# Patient Record
Sex: Male | Born: 1990 | Race: Black or African American | Hispanic: No | Marital: Single | Smoking: Never smoker
Health system: Southern US, Community
[De-identification: ages and names within clinical notes are randomized; demographics above are authoritative.]

---

## 2018-08-08 ENCOUNTER — Encounter (HOSPITAL_BASED_OUTPATIENT_CLINIC_OR_DEPARTMENT_OTHER): Payer: Self-pay | Admitting: Emergency Medicine

## 2018-08-08 ENCOUNTER — Emergency Department (HOSPITAL_BASED_OUTPATIENT_CLINIC_OR_DEPARTMENT_OTHER)
Admission: EM | Admit: 2018-08-08 | Discharge: 2018-08-08 | Disposition: A | Discharge status: Home / Self Care | Payer: Self-pay | Attending: Emergency Medicine | Admitting: Emergency Medicine

## 2018-08-08 ENCOUNTER — Ambulatory Visit (HOSPITAL_BASED_OUTPATIENT_CLINIC_OR_DEPARTMENT_OTHER)
Admission: RE | Admit: 2018-08-08 | Discharge: 2018-08-08 | Disposition: A | Discharge status: Home / Self Care | Payer: Self-pay | Source: Ambulatory Visit | Attending: Emergency Medicine | Admitting: Emergency Medicine

## 2018-08-08 ENCOUNTER — Other Ambulatory Visit: Payer: Self-pay

## 2018-08-08 DIAGNOSIS — Z711 Person with feared health complaint in whom no diagnosis is made: Secondary | ICD-10-CM

## 2018-08-08 DIAGNOSIS — R112 Nausea with vomiting, unspecified: Secondary | ICD-10-CM | POA: Insufficient documentation

## 2018-08-08 DIAGNOSIS — R197 Diarrhea, unspecified: Secondary | ICD-10-CM | POA: Insufficient documentation

## 2018-08-08 DIAGNOSIS — R160 Hepatomegaly, not elsewhere classified: Secondary | ICD-10-CM | POA: Insufficient documentation

## 2018-08-08 DIAGNOSIS — R748 Abnormal levels of other serum enzymes: Secondary | ICD-10-CM

## 2018-08-08 DIAGNOSIS — R1013 Epigastric pain: Secondary | ICD-10-CM | POA: Insufficient documentation

## 2018-08-08 LAB — CBC WITH DIFFERENTIAL/PLATELET
Abs Immature Granulocytes: 0.02 10*3/uL (ref 0.00–0.07)
Basophils Absolute: 0 10*3/uL (ref 0.0–0.1)
Basophils Relative: 0 %
Eosinophils Absolute: 0.1 10*3/uL (ref 0.0–0.5)
Eosinophils Relative: 1 %
HCT: 44.9 % (ref 39.0–52.0)
Hemoglobin: 13.5 g/dL (ref 13.0–17.0)
Immature Granulocytes: 0 %
Lymphocytes Relative: 20 %
Lymphs Abs: 1.6 10*3/uL (ref 0.7–4.0)
MCH: 20.6 pg — ABNORMAL LOW (ref 26.0–34.0)
MCHC: 30.1 g/dL (ref 30.0–36.0)
MCV: 68.7 fL — ABNORMAL LOW (ref 80.0–100.0)
Monocytes Absolute: 1 10*3/uL (ref 0.1–1.0)
Monocytes Relative: 12 %
Neutro Abs: 5.2 10*3/uL (ref 1.7–7.7)
Neutrophils Relative %: 67 %
Platelets: 213 10*3/uL (ref 150–400)
RBC: 6.54 MIL/uL — ABNORMAL HIGH (ref 4.22–5.81)
RDW: 17.5 % — ABNORMAL HIGH (ref 11.5–15.5)
WBC: 7.9 10*3/uL (ref 4.0–10.5)
nRBC: 0 % (ref 0.0–0.2)

## 2018-08-08 LAB — URINALYSIS, ROUTINE W REFLEX MICROSCOPIC
Bilirubin Urine: NEGATIVE
Glucose, UA: NEGATIVE mg/dL
Hgb urine dipstick: NEGATIVE
Ketones, ur: NEGATIVE mg/dL
Leukocytes,Ua: NEGATIVE
Nitrite: NEGATIVE
Protein, ur: NEGATIVE mg/dL
Specific Gravity, Urine: >1.03 — ABNORMAL HIGH (ref 1.005–1.030)
pH: 5.5 (ref 5.0–8.0)

## 2018-08-08 LAB — LIPASE, BLOOD: Lipase: 188 U/L — ABNORMAL HIGH (ref 11–51)

## 2018-08-08 LAB — COMPREHENSIVE METABOLIC PANEL
ALT: 18 U/L (ref 0–44)
AST: 19 U/L (ref 15–41)
Albumin: 4.4 g/dL (ref 3.5–5.0)
Alkaline Phosphatase: 39 U/L (ref 38–126)
Anion gap: 10 (ref 5–15)
BUN: 15 mg/dL (ref 6–20)
CO2: 23 mmol/L (ref 22–32)
Calcium: 9.2 mg/dL (ref 8.9–10.3)
Chloride: 103 mmol/L (ref 98–111)
Creatinine, Ser: 0.97 mg/dL (ref 0.61–1.24)
GFR calc Af Amer: >60 mL/min (ref >60)
GFR calc non Af Amer: >60 mL/min (ref >60)
Glucose, Bld: 115 mg/dL — ABNORMAL HIGH (ref 70–99)
Potassium: 3.5 mmol/L (ref 3.5–5.1)
Sodium: 136 mmol/L (ref 135–145)
Total Bilirubin: 1 mg/dL (ref 0.3–1.2)
Total Protein: 7.8 g/dL (ref 6.5–8.1)

## 2018-08-08 MED ORDER — LIDOCAINE HCL (PF) 1 % IJ SOLN
INTRAMUSCULAR | Status: AC
  Administered 2018-08-08: 04:00:00 5 mL
  Filled 2018-08-08: qty 5

## 2018-08-08 MED ORDER — ONDANSETRON 4 MG PO TBDP: 4.0000 mg | ORAL_TABLET | Freq: Three times a day (TID) | ORAL | 0 refills | Status: AC | PRN

## 2018-08-08 MED ORDER — ONDANSETRON 4 MG PO TBDP
4.0000 mg | ORAL_TABLET | Freq: Once | ORAL | Status: AC
  Administered 2018-08-08: 03:00:00 4 mg via ORAL
  Filled 2018-08-08: qty 1

## 2018-08-08 MED ORDER — AZITHROMYCIN 250 MG PO TABS
1000.0000 mg | ORAL_TABLET | Freq: Once | ORAL | Status: AC
  Administered 2018-08-08: 1000 mg via ORAL
  Filled 2018-08-08: qty 4

## 2018-08-08 MED ORDER — METRONIDAZOLE 500 MG PO TABS
2000.0000 mg | ORAL_TABLET | Freq: Once | ORAL | Status: AC
  Administered 2018-08-08: 04:00:00 2000 mg via ORAL
  Filled 2018-08-08: qty 4

## 2018-08-08 MED ORDER — CEFTRIAXONE SODIUM 250 MG IJ SOLR
250.0000 mg | Freq: Once | INTRAMUSCULAR | Status: AC
  Administered 2018-08-08: 04:00:00 250 mg via INTRAMUSCULAR
  Filled 2018-08-08: qty 250

## 2018-08-08 NOTE — Discharge Instructions (Signed)
You were seen today for abdominal bloating, vomiting, and loose stools.  Your work-up shows a mildly elevated lipase.  This can sometimes represent inflammation of the pancreas.  Avoid alcohol.  Return later today for an ultrasound of your gallbladder.  Clear liquid diet until symptoms improve.  You were given Zofran for nausea.  Your urine did not show any evidence of trichomonas.  Your additional STD testing is pending.  You were treated.  You need to abstain from sexual activity for the next 10 days.  Always use a condom.

## 2018-08-08 NOTE — ED Provider Notes (Signed)
[facility] EMERGENCY DEPARTMENT Provider Note   CSN: [id number] Arrival date & time: 08/08/18  0200    History   Chief Complaint Chief Complaint  Patient presents with  . Diarrhea  . Emesis    HPI Albert Carey is a 28 y.o. male.     HPI  This is a 28 year old male who presents with vomiting and diarrhea.  Patient reports that "my stomach has felt weird" for the last 2 weeks.  He reports a full sensation after eating.He states that sometimes it wakes him up at night.  It woke him up tonight and he had one episode of nonbilious, nonbloody emesis.  Additionally he has had loose stools.  He denies any blood in his stools.  Patient denies any recent travel or sick contacts.  He denies fevers.  He does report possible exposure to trichomonas.  He denies any symptoms including penile discharge or dysuria.  He currently has no abdominal pain.  He has not taken anything for his symptoms.  History reviewed. No pertinent past medical history.  There are no active problems to display for this patient.   History reviewed. No pertinent surgical history.      Home Medications    Prior to Admission medications   Medication Sig Start Date End Date Taking? Authorizing Provider  ondansetron (ZOFRAN ODT) 4 MG disintegrating tablet Take 1 tablet (4 mg total) by mouth every 8 (eight) hours as needed. 08/08/18   Gwendelyn Lanting, Mayer Masker, MD    Family History No family history on file.  Social History Social History   Tobacco Use  . Smoking status: Never Smoker  . Smokeless tobacco: Never Used  Substance Use Topics  . Alcohol use: Never    Frequency: Never  . Drug use: Never     Allergies   Patient has no known allergies.   Review of Systems Review of Systems  Constitutional: Negative for fever.  Respiratory: Negative for shortness of breath.   Cardiovascular: Negative for chest pain.  Gastrointestinal: Positive for abdominal pain, diarrhea, nausea and vomiting.   Genitourinary: Negative for discharge and dysuria.  All other systems reviewed and are negative.    Physical Exam Updated Vital Signs BP (!) 148/90 (BP Location: Right Arm)   Pulse 88   Temp 99.2 F (37.3 C) (Oral)   Resp 18   Ht 1.778 m (5\' 10" )   Wt 81.6 kg   SpO2 100%   BMI 25.83 kg/m   Physical Exam Vitals signs and nursing note reviewed.  Constitutional:      Appearance: He is well-developed. He is not ill-appearing.  HENT:     Head: Normocephalic and atraumatic.  Eyes:     Pupils: Pupils are equal, round, and reactive to light.  Cardiovascular:     Rate and Rhythm: Normal rate and regular rhythm.     Heart sounds: Normal heart sounds. No murmur.  Pulmonary:     Effort: Pulmonary effort is normal. No respiratory distress.     Breath sounds: Normal breath sounds. No wheezing.  Abdominal:     General: Bowel sounds are normal.     Palpations: Abdomen is soft.     Tenderness: There is no abdominal tenderness. There is no rebound.  Lymphadenopathy:     Cervical: No cervical adenopathy.  Skin:    General: Skin is warm and dry.  Neurological:     Mental Status: He is alert and oriented to person, place, and time.  Psychiatric:  Mood and Affect: Mood normal.      ED Treatments / Results  Labs (all labs ordered are listed, but only abnormal results are displayed) Labs Reviewed  URINALYSIS, ROUTINE W REFLEX MICROSCOPIC - Abnormal; Notable for the following components:      Result Value   Specific Gravity, Urine >1.030 (*)    All other components within normal limits  CBC WITH DIFFERENTIAL/PLATELET - Abnormal; Notable for the following components:   RBC 6.54 (*)    MCV 68.7 (*)    MCH 20.6 (*)    RDW 17.5 (*)    All other components within normal limits  LIPASE, BLOOD - Abnormal; Notable for the following components:   Lipase 188 (*)    All other components within normal limits  COMPREHENSIVE METABOLIC PANEL - Abnormal; Notable for the following  components:   Glucose, Bld 115 (*)    All other components within normal limits  GC/CHLAMYDIA PROBE AMP ([place]) NOT AT [facility]    EKG None  Radiology No results found.  Procedures Procedures (including critical care time)  Medications Ordered in ED Medications  cefTRIAXone (ROCEPHIN) injection 250 mg (has no administration in time range)  metroNIDAZOLE (FLAGYL) tablet 2,000 mg (has no administration in time range)  azithromycin (ZITHROMAX) tablet 1,000 mg (has no administration in time range)  lidocaine (PF) (XYLOCAINE) 1 % injection (has no administration in time range)  ondansetron (ZOFRAN-ODT) disintegrating tablet 4 mg (4 mg Oral Given 08/08/18 0231)     Initial Impression / Assessment and Plan / ED Course  I have reviewed the triage vital signs and the nursing notes.  Pertinent labs & imaging results that were available during my care of the patient were reviewed by me and considered in my medical decision making (see chart for details).        Patient presents with abdominal bloating.  1 episode of emesis and loose stools at home.  Also reports concerns for STD.  He is overall nontoxic-appearing and has a benign abdomen.  He was tested and treated for STDs.  Lab work-up is largely unremarkable with exception of a lipase of 188.  This may represent mild pancreatitis.  Patient reports that he occasionally drinks alcohol but that seems to have exacerbated his symptoms.  I recommend abstaining from alcohol.  He is able to tolerate fluids without difficulty.  Will trial symptom control at home.  He will return for right upper quadrant ultrasound to rule out gallbladder etiology.  After history, exam, and medical workup I feel the patient has been appropriately medically screened and is safe for discharge home. Pertinent diagnoses were discussed with the patient. Patient was given return precautions.   Final Clinical Impressions(s) / ED Diagnoses   Final diagnoses:  Nausea  vomiting and diarrhea  Elevated lipase  Concern about STD in male without diagnosis    ED Discharge Orders         Ordered    US Abdomen Limited RUQ/Gall Bladder     08/08/18 0329    ondansetron (ZOFRAN ODT) 4 MG disintegrating tablet  Every 8 hours PRN     08/08/18 0329           Shon BatonHorton, Andreea Arca F, MD 08/08/18 [phone]

## 2018-08-08 NOTE — ED Triage Notes (Signed)
Reports "my food has not been digesting and soft stools" for a few weeks. Denies fever, cough, SHOB. Also mentions possible trichomonas exposure. Denies pain.

## 2018-08-09 LAB — GC/CHLAMYDIA PROBE AMP (~~LOC~~) NOT AT ARMC
Chlamydia: NEGATIVE
Neisseria Gonorrhea: NEGATIVE

## 2019-03-13 ENCOUNTER — Emergency Department (HOSPITAL_COMMUNITY)
Admission: EM | Admit: 2019-03-13 | Discharge: 2019-03-13 | Disposition: A | Discharge status: Home / Self Care | Payer: Self-pay | Attending: Emergency Medicine | Admitting: Emergency Medicine

## 2019-03-13 ENCOUNTER — Other Ambulatory Visit: Payer: Self-pay

## 2019-03-13 ENCOUNTER — Encounter (HOSPITAL_COMMUNITY): Payer: Self-pay

## 2019-03-13 DIAGNOSIS — Z202 Contact with and (suspected) exposure to infections with a predominantly sexual mode of transmission: Secondary | ICD-10-CM | POA: Insufficient documentation

## 2019-03-13 LAB — URINALYSIS, ROUTINE W REFLEX MICROSCOPIC
Bacteria, UA: NONE SEEN
Bilirubin Urine: NEGATIVE
Glucose, UA: NEGATIVE mg/dL
Hgb urine dipstick: NEGATIVE
Ketones, ur: NEGATIVE mg/dL
Leukocytes,Ua: NEGATIVE
Nitrite: NEGATIVE
Protein, ur: NEGATIVE mg/dL
Specific Gravity, Urine: 1.032 — ABNORMAL HIGH (ref 1.005–1.030)
pH: 6 (ref 5.0–8.0)

## 2019-03-13 LAB — HIV ANTIBODY (ROUTINE TESTING W REFLEX): HIV Screen 4th Generation wRfx: NONREACTIVE

## 2019-03-13 MED ORDER — AZITHROMYCIN 250 MG PO TABS
1000.0000 mg | ORAL_TABLET | Freq: Once | ORAL | Status: AC
  Administered 2019-03-13: 1000 mg via ORAL
  Filled 2019-03-13: qty 4

## 2019-03-13 MED ORDER — CEFTRIAXONE SODIUM 250 MG IJ SOLR
250.0000 mg | Freq: Once | INTRAMUSCULAR | Status: AC
  Administered 2019-03-13: 250 mg via INTRAMUSCULAR
  Filled 2019-03-13: qty 250

## 2019-03-13 NOTE — Discharge Instructions (Addendum)
Will be called to your STD results if they are positive.  If positive you will need to notify your partners.  You were given antibiotics in the emergency department in case her gonorrhea or chlamydia test was positive.  If your HIV and syphilis test is positive you will need follow-up with infectious disease.  Return to emergency department for any new or worsening symptoms.

## 2019-03-13 NOTE — ED Provider Notes (Addendum)
[facility] EMERGENCY DEPARTMENT Provider Note   CSN: [id number] Arrival date & time: 03/13/19  1324     History Chief Complaint  Patient presents with  . Exposure to STD    Albert Carey is a 28 y.o. male with no prior medical history who presents for evaluation of abnormal sensation with urination.  Patient states today he noticed it "feels weird" when he urinates.  He denies any pain.  He is concerned about STDs.  Denies any drainage.  He is sexually active with male and uses protection.  Patient requesting HIV and syphilis testing in addition.  He denies fever, chills, nausea, vomiting, abdominal pain, testicular pain, rashes or lesions, pain with bowel movements, hematuria, flank pain, diarrhea or constipation.  Not additional aggravating or alleviating factors.  Patient states he is able to urinate without difficulty.  History obtained from patient and past medical records.  No interpreter is used.  HPI     History reviewed. No pertinent past medical history.  There are no problems to display for this patient.   History reviewed. No pertinent surgical history.     No family history on file.  Social History   Tobacco Use  . Smoking status: Never Smoker  . Smokeless tobacco: Never Used  Substance Use Topics  . Alcohol use: Never  . Drug use: Never    Home Medications Prior to Admission medications   Medication Sig Start Date End Date Taking? Authorizing Provider  ondansetron (ZOFRAN ODT) 4 MG disintegrating tablet Take 1 tablet (4 mg total) by mouth every 8 (eight) hours as needed. 08/08/18   Horton, Mayer Masker, MD    Allergies    Patient has no known allergies.  Review of Systems   Review of Systems  Constitutional: Negative.   HENT: Negative.   Respiratory: Negative.   Cardiovascular: Negative.   Gastrointestinal: Negative.   Genitourinary: Positive for dysuria. Negative for decreased urine volume, difficulty urinating, discharge,  enuresis, flank pain, frequency, genital sores, hematuria, penile pain, penile swelling, scrotal swelling, testicular pain and urgency.  Musculoskeletal: Negative.   Skin: Negative.   Neurological: Negative.   All other systems reviewed and are negative.   Physical Exam Updated Vital Signs BP 127/86   Pulse 88   Temp 99 F (37.2 C) (Oral)   Resp 18   Ht 5\' 10"  (1.778 m)   Wt 81.6 kg   SpO2 97%   BMI 25.83 kg/m   Physical Exam Vitals and nursing note reviewed. Exam conducted with a chaperone present.  Constitutional:      General: He is not in acute distress.    Appearance: He is well-developed. He is not ill-appearing, toxic-appearing or diaphoretic.  HENT:     Head: Normocephalic and atraumatic.     Nose: Nose normal.     Mouth/Throat:     Mouth: Mucous membranes are moist.     Pharynx: Oropharynx is clear.  Eyes:     Pupils: Pupils are equal, round, and reactive to light.  Cardiovascular:     Rate and Rhythm: Normal rate and regular rhythm.  Pulmonary:     Effort: Pulmonary effort is normal. No respiratory distress.  Abdominal:     General: There is no distension.     Palpations: Abdomen is soft.     Tenderness: There is no abdominal tenderness. There is no right CVA tenderness, left CVA tenderness, guarding or rebound.     Comments: Soft, nontender without rebound or guarding  Genitourinary:  Comments: Refused GU exam Musculoskeletal:        General: Normal range of motion.     Cervical back: Normal range of motion and neck supple.  Skin:    General: Skin is warm and dry.  Neurological:     Mental Status: He is alert.    ED Results / Procedures / Treatments   Labs (all labs ordered are listed, but only abnormal results are displayed) Labs Reviewed  URINALYSIS, ROUTINE W REFLEX MICROSCOPIC - Abnormal; Notable for the following components:      Result Value   Specific Gravity, Urine 1.032 (*)    All other components within normal limits  RPR  HIV  ANTIBODY (ROUTINE TESTING W REFLEX)  GC/CHLAMYDIA PROBE AMP ([place]    EKG None  Radiology No results found.  Procedures Procedures (including critical care time)  Medications Ordered in ED Medications  cefTRIAXone (ROCEPHIN) injection 250 mg (250 mg Intramuscular Given 03/13/19 1539)  azithromycin (ZITHROMAX) tablet 1,000 mg (1,000 mg Oral Given 03/13/19 1540)    ED Course  I have reviewed the triage vital signs and the nursing notes.  Pertinent labs & imaging results that were available during my care of the patient were reviewed by me and considered in my medical decision making (see chart for details).  28 year old male presents for evaluation of possible exposure to STD.  Has had some "odd sensations" with urination however denies any frank pain.  No pain with bowel movements, rashes, lesions, penile discharge.  Refused GU exam.  Urinalysis negative for infection, urine GC sent due to lack of GU exam.  HIV and syphilis testing sent.  Abdomen soft, nontender.  He is able to urinate without difficulty.  Negative CVA tap, no hematuria.  Nominal pain, hematuria, flank pain to suggest renal stone.    Patient is afebrile without abdominal tenderness, abdominal pain or painful bowel movements to indicate prostatitis. STD cultures obtained including HIV, syphilis, gonorrhea and chlamydia. Patient to be discharged with instructions to follow up with PCP. Discussed importance of using protection when sexually active. Pt understands that they have GC/Chlamydia cultures pending and that they will need to inform all sexual partners if results return positive. Patient has been treated prophylactically with azithromycin and Rocephin.   The patient has been appropriately medically screened and/or stabilized in the ED. I have low suspicion for any other emergent medical condition which would require further screening, evaluation or treatment in the ED or require inpatient  management.  Patient is hemodynamically stable and in no acute distress.  Patient able to ambulate in department prior to ED.  Evaluation does not show acute pathology that would require ongoing or additional emergent interventions while in the emergency department or further inpatient treatment.  I have discussed the diagnosis with the patient and answered all questions.  Pain is been managed while in the emergency department and patient has no further complaints prior to discharge.  Patient is comfortable with plan discussed in room and is stable for discharge at this time.  I have discussed strict return precautions for returning to the emergency department.  Patient was encouraged to follow-up with PCP/specialist refer to at discharge.  Clinical Course as of Mar 12 1552  Mon Mar 13, 2019  1553 Negative for infection, no blood  Urinalysis, Routine w reflex microscopic- may I&O cath if menses(!) [BH]    Clinical Course User Index [BH] Draxton Luu A, PA-C   MDM Rules/Calculators/A&P  Final Clinical Impression(s) / ED Diagnoses Final diagnoses:  STD exposure    Rx / DC Orders ED Discharge Orders    None       Rosalin Buster A, PA-C 03/13/19 1552    Letanya Froh A, PA-C 03/13/19 1553    Tegeler, Gwenyth Allegra, MD 03/13/19 [phone]

## 2019-03-13 NOTE — ED Notes (Signed)
Pt providing a urine sample

## 2019-03-13 NOTE — ED Triage Notes (Signed)
Pt requesting STD check due to burning with urination, denies discharge

## 2019-03-13 NOTE — ED Notes (Signed)
Patient verbalizes understanding of discharge instructions. Opportunity for questioning and answers were provided. Armband removed by staff, pt discharged from ED.  

## 2019-03-14 LAB — RPR: RPR Ser Ql: NONREACTIVE

## 2019-03-15 LAB — GC/CHLAMYDIA PROBE AMP (~~LOC~~) NOT AT ARMC
Chlamydia: NEGATIVE
Neisseria Gonorrhea: NEGATIVE

## 2019-09-11 IMAGING — US ULTRASOUND ABDOMEN LIMITED
1 series · 14 of 25 positions shown · non-contrast
Comparison: None.

CLINICAL DATA: 27-year-old male with a history of elevated lipase
and epigastric pain

EXAM:
ULTRASOUND ABDOMEN LIMITED RIGHT UPPER QUADRANT

[Series 1: ultrasound abdomen limited · 14 of 58 slices shown]
[im 1/58]
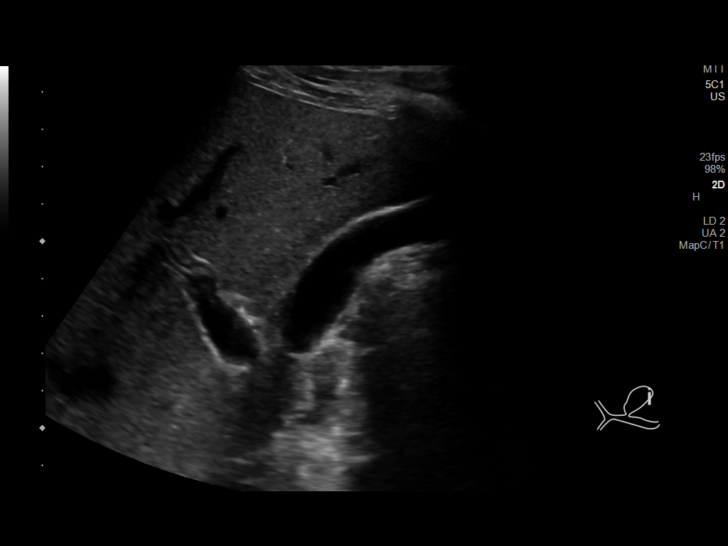
[im 5/58]
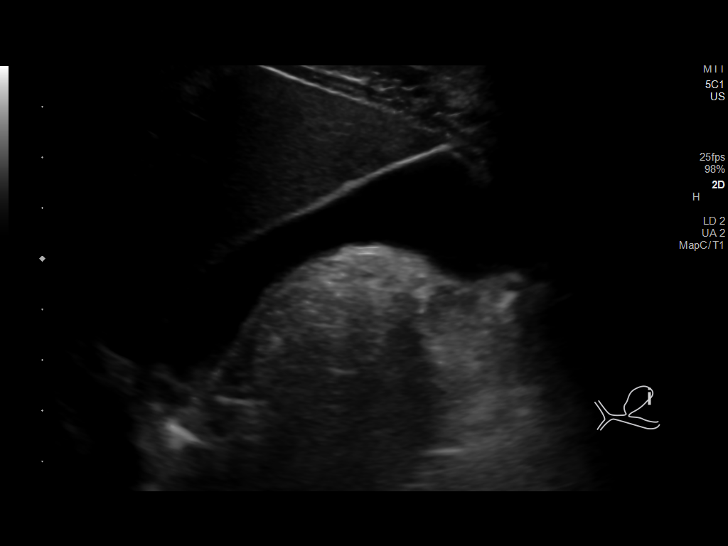
[im 10/58]
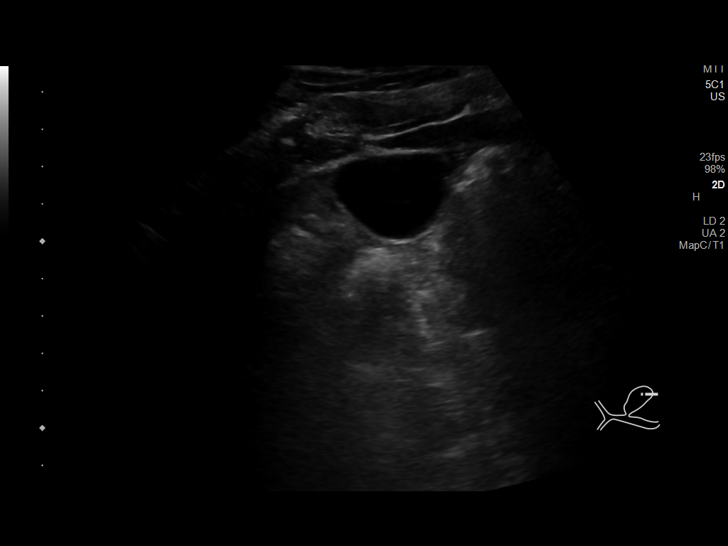
[im 15/58]
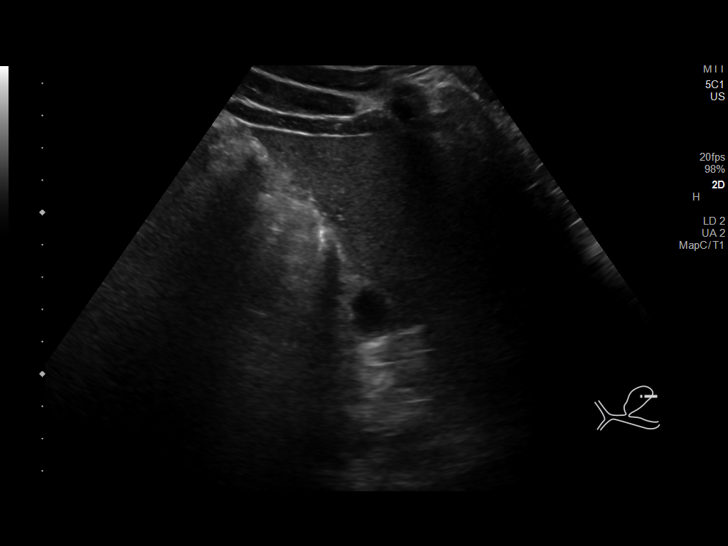
[im 20/58]
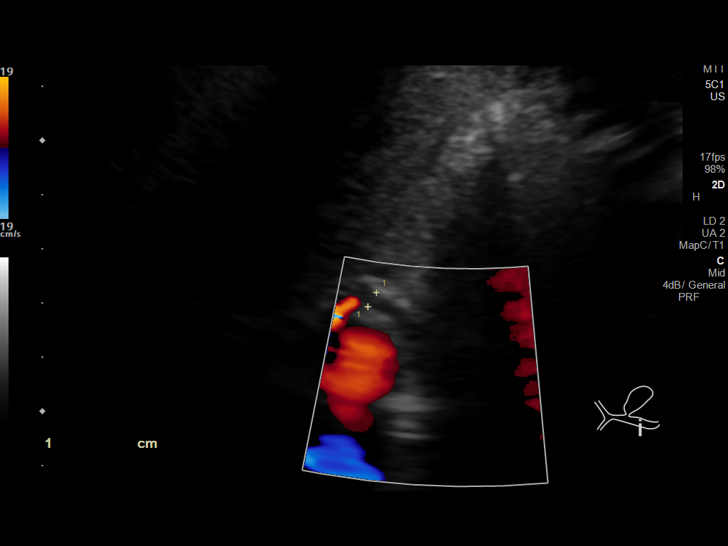
[im 22/58]
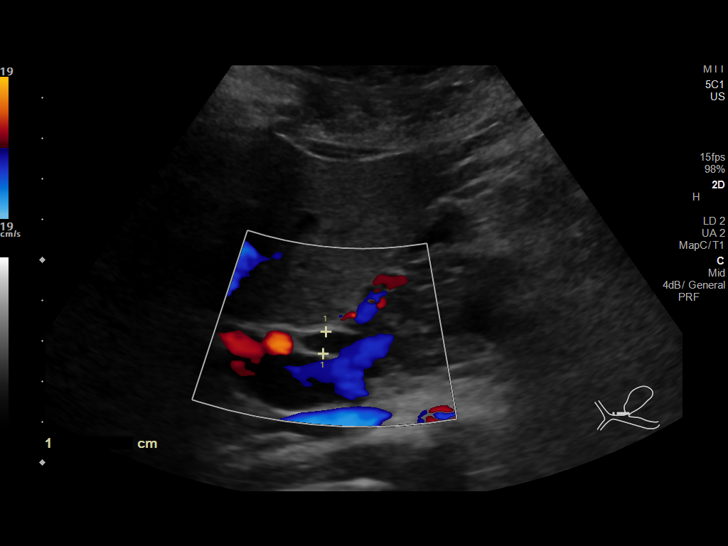
[im 27/58]
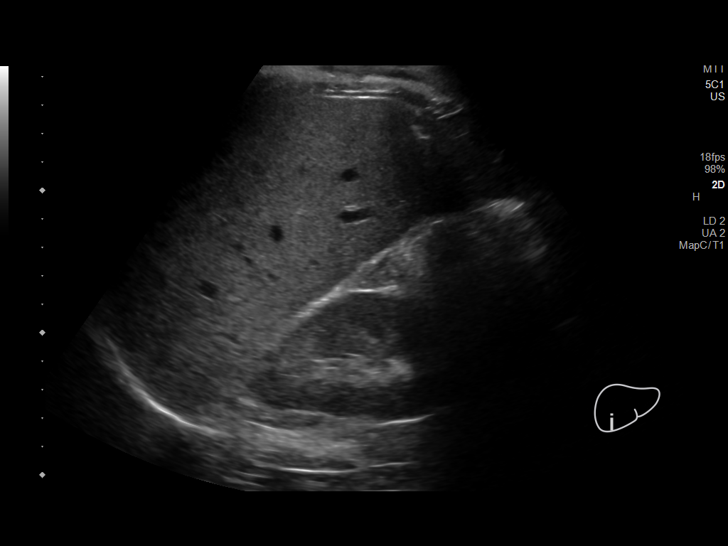
[im 31/58]
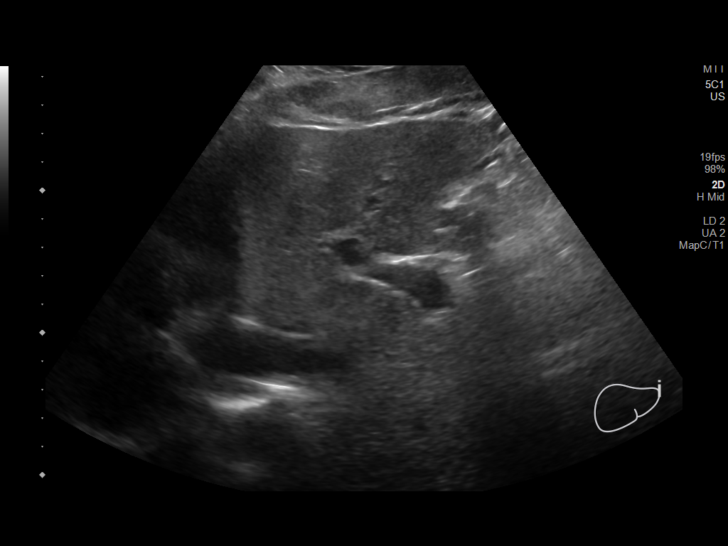
[im 36/58]
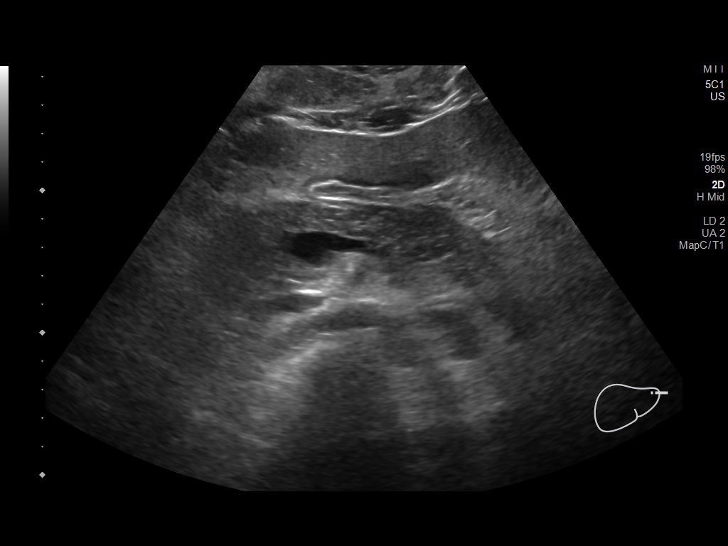
[im 39/58]
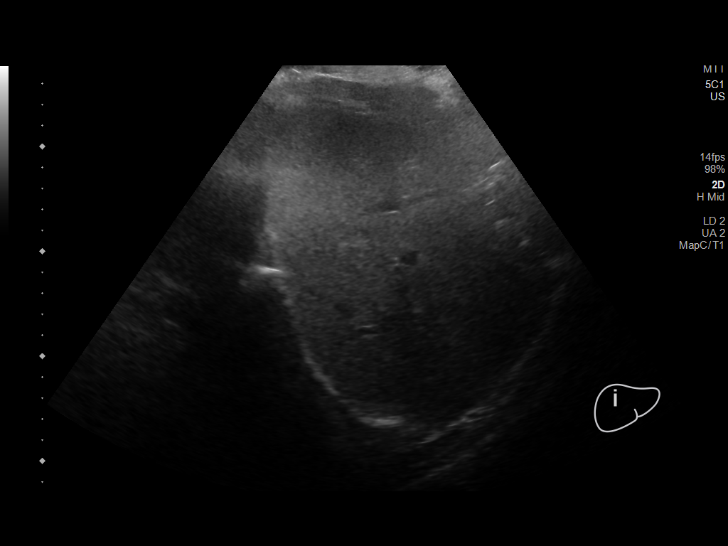
[im 43/58]
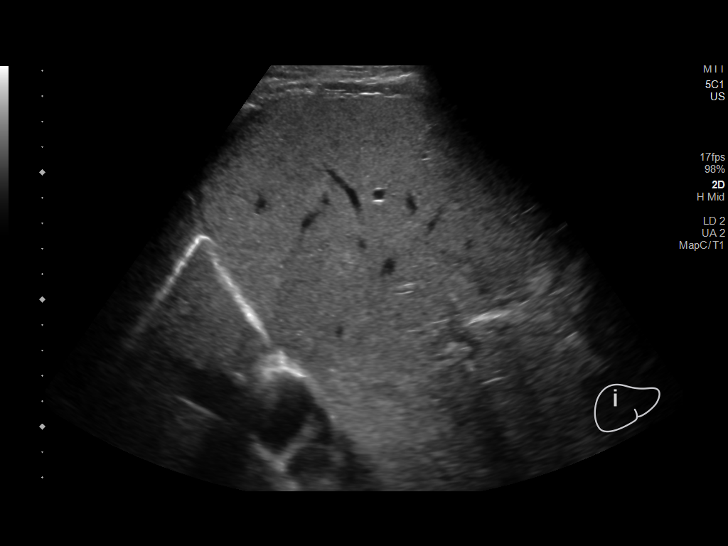
[im 48/58]
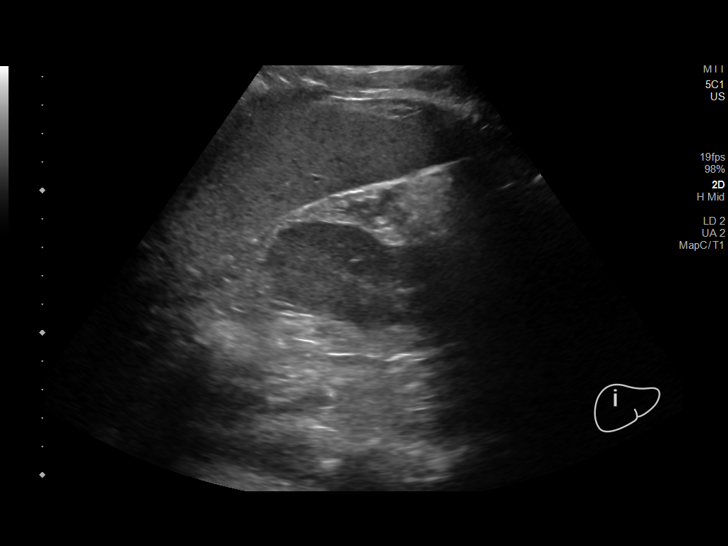
[im 53/58]
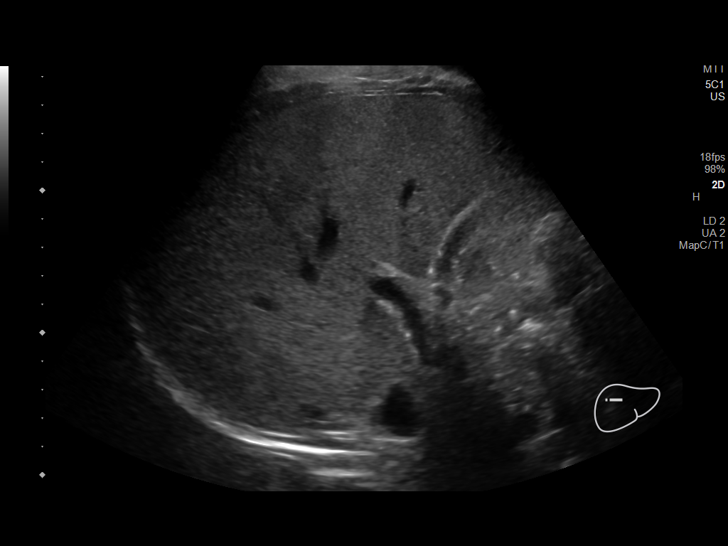
[im 58/58]
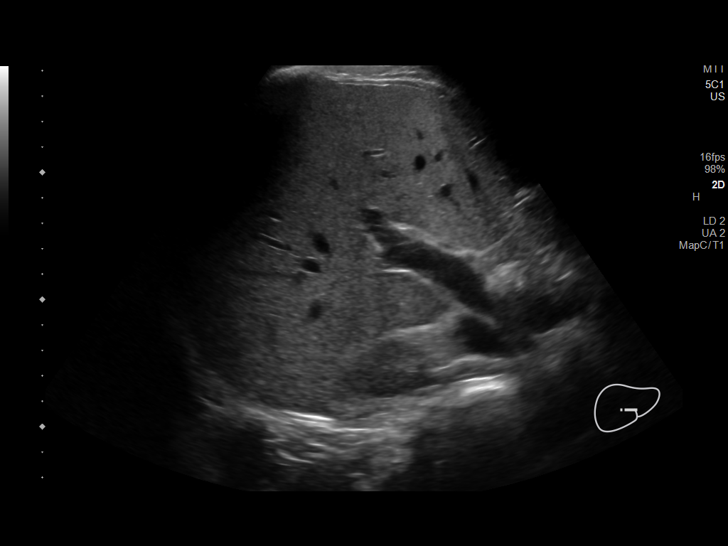

[14 of 25 positions shown; findings below may reference images not displayed]

FINDINGS: Gallbladder:

Negative sonographic Murphy's sign. Anechoic fluid within the
gallbladder lumen with no gallbladder wall thickening or
pericholecystic fluid.

Common bile duct:

Diameter: 5 mm-6 mm

Liver:

Mild increased echogenicity of liver parenchyma. Portal vein is
patent on color Doppler imaging with normal direction of blood flow
towards the liver.
IMPRESSION: No evidence of cholelithiasis.

The common bile duct at the liver hilum measures 5 mm-6 mm, which is
borderline enlarged in a patient of this age. If there is concern
for common bile duct stone, correlation with CT imaging may be
useful.

Mild increased echotexture/echogenicity of liver, potentially
steatosis.
